# Patient Record
Sex: Female | Born: 1980 | Race: Black or African American | Hispanic: No | Smoking: Never smoker
Health system: Southern US, Community
[De-identification: ages and names within clinical notes are randomized; demographics above are authoritative.]

## PROBLEM LIST (undated history)

## (undated) DIAGNOSIS — D649 Anemia, unspecified: Secondary | ICD-10-CM

## (undated) DIAGNOSIS — D219 Benign neoplasm of connective and other soft tissue, unspecified: Secondary | ICD-10-CM

## (undated) HISTORY — PX: INDUCED ABORTION: SHX677

## (undated) HISTORY — PX: WISDOM TOOTH EXTRACTION: SHX21

---

## 2012-06-08 ENCOUNTER — Other Ambulatory Visit (HOSPITAL_COMMUNITY)
Admission: RE | Admit: 2012-06-08 | Discharge: 2012-06-08 | Disposition: A | Discharge status: Home / Self Care | Payer: BC Managed Care – PPO | Source: Ambulatory Visit | Attending: Obstetrics and Gynecology | Admitting: Obstetrics and Gynecology

## 2012-06-08 DIAGNOSIS — Z01419 Encounter for gynecological examination (general) (routine) without abnormal findings: Secondary | ICD-10-CM | POA: Insufficient documentation

## 2012-06-08 DIAGNOSIS — Z1151 Encounter for screening for human papillomavirus (HPV): Secondary | ICD-10-CM | POA: Insufficient documentation

## 2012-06-08 DIAGNOSIS — Z113 Encounter for screening for infections with a predominantly sexual mode of transmission: Secondary | ICD-10-CM | POA: Insufficient documentation

## 2014-08-22 ENCOUNTER — Other Ambulatory Visit (HOSPITAL_COMMUNITY)
Admission: RE | Admit: 2014-08-22 | Discharge: 2014-08-22 | Disposition: A | Discharge status: Home / Self Care | Payer: Medicaid Other | Source: Ambulatory Visit | Attending: Nurse Practitioner | Admitting: Nurse Practitioner

## 2014-08-22 ENCOUNTER — Other Ambulatory Visit: Payer: Self-pay | Admitting: Nurse Practitioner

## 2014-08-22 DIAGNOSIS — Z01419 Encounter for gynecological examination (general) (routine) without abnormal findings: Secondary | ICD-10-CM | POA: Diagnosis not present

## 2014-08-22 DIAGNOSIS — Z113 Encounter for screening for infections with a predominantly sexual mode of transmission: Secondary | ICD-10-CM | POA: Diagnosis present

## 2014-08-23 LAB — CYTOLOGY - PAP

## 2015-10-24 ENCOUNTER — Encounter (HOSPITAL_COMMUNITY): Payer: Self-pay | Admitting: Emergency Medicine

## 2015-10-24 ENCOUNTER — Ambulatory Visit (HOSPITAL_COMMUNITY)
Admission: EM | Admit: 2015-10-24 | Discharge: 2015-10-24 | Disposition: A | Discharge status: Home / Self Care | Payer: Medicaid Other | Attending: Family Medicine | Admitting: Family Medicine

## 2015-10-24 DIAGNOSIS — R52 Pain, unspecified: Secondary | ICD-10-CM

## 2015-10-24 MED ORDER — DICLOFENAC SODIUM 50 MG PO TBEC: 50.0000 mg | DELAYED_RELEASE_TABLET | Freq: Two times a day (BID) | ORAL | 1 refills | Status: DC
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
## 2015-10-25 ENCOUNTER — Emergency Department (HOSPITAL_BASED_OUTPATIENT_CLINIC_OR_DEPARTMENT_OTHER): Payer: No Typology Code available for payment source

## 2015-10-25 ENCOUNTER — Emergency Department (HOSPITAL_BASED_OUTPATIENT_CLINIC_OR_DEPARTMENT_OTHER)
Admission: EM | Admit: 2015-10-25 | Discharge: 2015-10-25 | Disposition: A | Discharge status: Home / Self Care | Payer: No Typology Code available for payment source | Attending: Emergency Medicine | Admitting: Emergency Medicine

## 2015-10-25 ENCOUNTER — Encounter (HOSPITAL_BASED_OUTPATIENT_CLINIC_OR_DEPARTMENT_OTHER): Payer: Self-pay | Admitting: Emergency Medicine

## 2015-10-25 DIAGNOSIS — Z79899 Other long term (current) drug therapy: Secondary | ICD-10-CM | POA: Insufficient documentation

## 2015-10-25 DIAGNOSIS — S39012D Strain of muscle, fascia and tendon of lower back, subsequent encounter: Secondary | ICD-10-CM | POA: Insufficient documentation

## 2015-10-25 DIAGNOSIS — Y9389 Activity, other specified: Secondary | ICD-10-CM | POA: Diagnosis not present

## 2015-10-25 DIAGNOSIS — S060X0D Concussion without loss of consciousness, subsequent encounter: Secondary | ICD-10-CM | POA: Diagnosis not present

## 2015-10-25 DIAGNOSIS — S161XXD Strain of muscle, fascia and tendon at neck level, subsequent encounter: Secondary | ICD-10-CM | POA: Diagnosis not present

## 2015-10-25 DIAGNOSIS — Y999 Unspecified external cause status: Secondary | ICD-10-CM | POA: Insufficient documentation

## 2015-10-25 DIAGNOSIS — S199XXD Unspecified injury of neck, subsequent encounter: Secondary | ICD-10-CM | POA: Diagnosis present

## 2015-10-25 DIAGNOSIS — Y92410 Unspecified street and highway as the place of occurrence of the external cause: Secondary | ICD-10-CM | POA: Insufficient documentation

## 2015-10-25 LAB — URINALYSIS, ROUTINE W REFLEX MICROSCOPIC
Bilirubin Urine: NEGATIVE
Glucose, UA: NEGATIVE mg/dL
Ketones, ur: 15 mg/dL — AB
NITRITE: NEGATIVE
PH: 6.5 (ref 5.0–8.0)
Protein, ur: 30 mg/dL — AB
SPECIFIC GRAVITY, URINE: 1.015 (ref 1.005–1.030)

## 2015-10-25 LAB — URINE MICROSCOPIC-ADD ON

## 2015-10-25 LAB — PREGNANCY, URINE: PREG TEST UR: NEGATIVE

## 2015-10-25 MED ORDER — IBUPROFEN 800 MG PO TABS: 800.0000 mg | ORAL_TABLET | Freq: Three times a day (TID) | ORAL | 0 refills | Status: DC

## 2015-10-25 MED ORDER — KETOROLAC TROMETHAMINE 60 MG/2ML IM SOLN
60.0000 mg | Freq: Once | INTRAMUSCULAR | Status: AC
  Administered 2015-10-25: 60 mg via INTRAMUSCULAR
  Filled 2015-10-25: qty 2

## 2015-10-25 MED ORDER — ORPHENADRINE CITRATE ER 100 MG PO TB12: 100.0000 mg | ORAL_TABLET | Freq: Two times a day (BID) | ORAL | 0 refills | Status: DC
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
## 2015-10-25 NOTE — ED Notes (Signed)
Patient transported to X-ray 
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
## 2015-10-27 LAB — URINE CULTURE

## 2015-10-28 ENCOUNTER — Ambulatory Visit: Payer: Medicaid Other | Admitting: Family Medicine

## 2016-03-14 ENCOUNTER — Inpatient Hospital Stay (HOSPITAL_COMMUNITY)
Admission: AD | Admit: 2016-03-14 | Discharge: 2016-03-14 | Disposition: A | Discharge status: Home / Self Care | Payer: BLUE CROSS/BLUE SHIELD | Source: Ambulatory Visit | Attending: Obstetrics and Gynecology | Admitting: Obstetrics and Gynecology

## 2016-03-14 ENCOUNTER — Encounter (HOSPITAL_COMMUNITY): Payer: Self-pay | Admitting: *Deleted

## 2016-03-14 ENCOUNTER — Telehealth: Payer: Self-pay | Admitting: Obstetrics and Gynecology

## 2016-03-14 DIAGNOSIS — Z975 Presence of (intrauterine) contraceptive device: Secondary | ICD-10-CM

## 2016-03-14 DIAGNOSIS — D649 Anemia, unspecified: Secondary | ICD-10-CM

## 2016-03-14 DIAGNOSIS — N939 Abnormal uterine and vaginal bleeding, unspecified: Secondary | ICD-10-CM

## 2016-03-14 DIAGNOSIS — D6489 Other specified anemias: Secondary | ICD-10-CM | POA: Insufficient documentation

## 2016-03-14 DIAGNOSIS — Z79899 Other long term (current) drug therapy: Secondary | ICD-10-CM | POA: Insufficient documentation

## 2016-03-14 HISTORY — DX: Anemia, unspecified: D64.9

## 2016-03-14 HISTORY — DX: Benign neoplasm of connective and other soft tissue, unspecified: D21.9

## 2016-03-14 LAB — URINALYSIS, ROUTINE W REFLEX MICROSCOPIC
Bacteria, UA: NONE SEEN
Bilirubin Urine: NEGATIVE
GLUCOSE, UA: NEGATIVE mg/dL
Ketones, ur: 5 mg/dL — AB
LEUKOCYTES UA: NEGATIVE
NITRITE: NEGATIVE
PH: 5 (ref 5.0–8.0)
Protein, ur: NEGATIVE mg/dL
SPECIFIC GRAVITY, URINE: 1.021 (ref 1.005–1.030)

## 2016-03-14 LAB — CBC
HEMATOCRIT: 25 % — AB (ref 36.0–46.0)
HEMOGLOBIN: 8.3 g/dL — AB (ref 12.0–15.0)
MCH: 27.2 pg (ref 26.0–34.0)
MCHC: 33.2 g/dL (ref 30.0–36.0)
MCV: 82 fL (ref 78.0–100.0)
Platelets: 243 10*3/uL (ref 150–400)
RBC: 3.05 MIL/uL — ABNORMAL LOW (ref 3.87–5.11)
RDW: 19.5 % — ABNORMAL HIGH (ref 11.5–15.5)
WBC: 6.6 10*3/uL (ref 4.0–10.5)

## 2016-03-14 LAB — POCT PREGNANCY, URINE: PREG TEST UR: NEGATIVE

## 2016-03-14 MED ORDER — LACTATED RINGERS IV BOLUS (SEPSIS)
1000.0000 mL | Freq: Once | INTRAVENOUS | Status: AC
  Administered 2016-03-14: 1000 mL via INTRAVENOUS

## 2016-03-14 MED ORDER — NORGESTIMATE-ETH ESTRADIOL 0.25-35 MG-MCG PO TABS: 1.0000 | ORAL_TABLET | Freq: Every day | ORAL | 11 refills | Status: DC

## 2016-03-14 MED ORDER — SODIUM CHLORIDE 0.9 % IV SOLN
510.0000 mg | Freq: Once | INTRAVENOUS | Status: AC
  Administered 2016-03-14: 510 mg via INTRAVENOUS
  Filled 2016-03-14: qty 17

## 2016-03-14 NOTE — MAU Note (Signed)
Pt states that she has had problems ever since she got her mirena;

## 2016-03-14 NOTE — MAU Note (Signed)
C/o random vaginal bleeding (several times a month) with large blood clots for months; very heavy bleeding yesterday and felt faint; hx of fibroids; has a mirena in place; upt was negative today; has been told that she needs a hysterectomy;

## 2016-03-14 NOTE — MAU Provider Note (Signed)
History     CSN: [id number]  Arrival date and time: 03/14/16 [id number]   First Provider Initiated Contact with Patient 03/14/16 1026      Chief Complaint  Patient presents with  . Vaginal Bleeding   Non-pregnant female here with heavy vaginal bleeding. She reports VB every day since Mirena was placed 6 months ago. Some days she passes large blood clots and bleeding is heavy as did yesterday. Today bleeding is lite. She has many episodes of soiling her clothes. She reports passing out yesterday. She had pelvic US last week in office and was told uterine fibroids were larger, IUD was in place, and recommended a hysterectomy. No new partner, remote hx of trich.     Pertinent Gynecological History: Bleeding: dysfunctional uterine bleeding Contraception: IUD Blood transfusions: none Sexually transmitted diseases: past history: +trich Last pap: normal Date: 2016   Past Medical History:  Diagnosis Date  . Anemia   . Fibroid     Past Surgical History:  Procedure Laterality Date  . CESAREAN SECTION    . INDUCED ABORTION    . WISDOM TOOTH EXTRACTION      No family history on file.  Social History  Substance Use Topics  . Smoking status: Never Smoker  . Smokeless tobacco: Never Used  . Alcohol use No    Allergies: No Known Allergies  Prescriptions Prior to Admission  Medication Sig Dispense Refill Last Dose  . calcium carbonate (OSCAL) 1500 (600 Ca) MG TABS tablet Take 600 mg of elemental calcium by mouth 2 (two) times daily.   03/13/2016 at Unknown time  . ferrous sulfate 325 (65 FE) MG tablet Take 325 mg by mouth at bedtime.    03/13/2016 at Unknown time  . levonorgestrel (MIRENA) 20 MCG/24HR IUD 1 each by Intrauterine route once.   08/04/2015 at unknown  . Multiple Vitamin (MULTIVITAMIN WITH MINERALS) TABS tablet Take 1 tablet by mouth daily.   03/13/2016 at Unknown time    Review of Systems  Constitutional: Negative for chills and fever.  Respiratory: Negative for shortness  of breath.   Cardiovascular: Negative for chest pain.  Genitourinary: Positive for vaginal bleeding.  Neurological: Positive for dizziness (yesterday).   Physical Exam   Blood pressure 90/76, pulse (!) 125, temperature 98.4 F (36.9 C), temperature source Oral, resp. rate 18.  Physical Exam  Nursing note and vitals reviewed. Constitutional: She is oriented to person, place, and time. She appears well-developed and well-nourished. No distress.  HENT:  Head: Normocephalic and atraumatic.  Neck: Normal range of motion.  Respiratory: Effort normal.  GI: Soft. She exhibits no distension and no mass. There is no tenderness. There is no rebound and no guarding.  Genitourinary:  Genitourinary Comments: External: no lesions or erythema Vagina: rugated, parous, scant bloody discharge Uterus: non enlarged, anteverted, non tender, no CMT Adnexae: no masses, no tenderness left, no tenderness right   Musculoskeletal: Normal range of motion.  Neurological: She is alert and oriented to person, place, and time.  Skin: Skin is warm and dry.  Psychiatric: She has a normal mood and affect.   Results for orders placed or performed during the hospital encounter of 03/14/16 (from the past 24 hour(s))  Urinalysis, Routine w reflex microscopic     Status: Abnormal   Collection Time: 03/14/16  9:50 AM  Result Value Ref Range   Color, Urine YELLOW YELLOW   APPearance HAZY (A) CLEAR   Specific Gravity, Urine 1.021 1.005 - 1.030   pH 5.0 5.0 -  8.0   Glucose, UA NEGATIVE NEGATIVE mg/dL   Hgb urine dipstick MODERATE (A) NEGATIVE   Bilirubin Urine NEGATIVE NEGATIVE   Ketones, ur 5 (A) NEGATIVE mg/dL   Protein, ur NEGATIVE NEGATIVE mg/dL   Nitrite NEGATIVE NEGATIVE   Leukocytes, UA NEGATIVE NEGATIVE   RBC / HPF TOO NUMEROUS TO COUNT 0 - 5 RBC/hpf   WBC, UA TOO NUMEROUS TO COUNT 0 - 5 WBC/hpf   Bacteria, UA NONE SEEN NONE SEEN   Squamous Epithelial / LPF 0-5 (A) NONE SEEN   WBC Clumps PRESENT     Mucous PRESENT    Hyaline Casts, UA PRESENT   Pregnancy, urine POC     Status: None   Collection Time: 03/14/16 10:06 AM  Result Value Ref Range   Preg Test, Ur NEGATIVE NEGATIVE  CBC     Status: Abnormal   Collection Time: 03/14/16 10:44 AM  Result Value Ref Range   WBC 6.6 4.0 - 10.5 K/uL   RBC 3.05 (L) 3.87 - 5.11 MIL/uL   Hemoglobin 8.3 (L) 12.0 - 15.0 g/dL   HCT 25.0 (L) 36.0 - 46.0 %   MCV 82.0 78.0 - 100.0 fL   MCH 27.2 26.0 - 34.0 pg   MCHC 33.2 30.0 - 36.0 g/dL   RDW 19.5 (H) 11.5 - 15.5 %   Platelets 243 150 - 400 K/uL   MAU Course  Procedures LR 1 L Feraheme infusion   MDM Labs ordered and reviewed. Presentation, clinical findings, and plan discussed with Dr. Cletis Media. Pt feels much better after infusion and IVF. VSS. Stable for discharge home.   Assessment and Plan   1. Abnormal uterine bleeding (AUB)   2. IUD contraception   3. Symptomatic anemia    Discharge home Follow up with [facility] in 1 week Rx Sprintec taper Continue Fe  Allergies as of 03/14/2016   No Known Allergies     Medication List    TAKE these medications   calcium carbonate 1500 (600 Ca) MG Tabs tablet Commonly known as:  OSCAL Take 600 mg of elemental calcium by mouth 2 (two) times daily.   ferrous sulfate 325 (65 FE) MG tablet Take 325 mg by mouth at bedtime.   levonorgestrel 20 MCG/24HR IUD Commonly known as:  MIRENA 1 each by Intrauterine route once.   multivitamin with minerals Tabs tablet Take 1 tablet by mouth daily.   norgestimate-ethinyl estradiol 0.25-35 MG-MCG tablet Commonly known as:  ORTHO-CYCLEN,SPRINTEC,PREVIFEM Take 1 tablet by mouth daily.      Julianne Handler, CNM 03/14/2016, 10:27 AM

## 2016-03-14 NOTE — Discharge Instructions (Signed)
Abnormal Uterine Bleeding Abnormal uterine bleeding means bleeding from the vagina that is not your normal menstrual period. This can be:  Bleeding or spotting between periods.  Bleeding after sex (sexual intercourse).  Bleeding that is heavier or more than normal.  Periods that last longer than usual.  Bleeding after menopause. There are many problems that may cause this. Treatment will depend on the cause of the bleeding. Any kind of bleeding that is not normal should be reviewed by your doctor. Follow these instructions at home: Watch your condition for any changes. These actions may lessen any discomfort you are having:  Do not use tampons or douches as told by your doctor.  Change your pads often. You should get regular pelvic exams and Pap tests. Keep all appointments for tests as told by your doctor. Contact a doctor if:  You are bleeding for more than 1 week.  You feel dizzy at times. Get help right away if:  You pass out.  You have to change pads every 15 to 30 minutes.  You have belly pain.  You have a fever.  You become sweaty or weak.  You are passing large blood clots from the vagina.  You feel sick to your stomach (nauseous) and throw up (vomit). This information is not intended to replace advice given to you by your health care provider. Make sure you discuss any questions you have with your health care provider. Document Released: 11/15/2008 Document Revised: 06/26/2015 Document Reviewed: 08/17/2012 Elsevier Interactive Patient Education  2017 [employer].

## 2016-03-14 NOTE — Telephone Encounter (Signed)
TC from pt. Managed by [facility] for fibroids, IUD in place. Reports heavy bleeding, to the point of saturating several pads, tampons, underwear and clothes. Concerned that she is not able to wait to be seen at the office tomorrow, go to church today, or work tomorrow. Advised to come to MAU to be formally evaluated; pt in agreement. Address to [facility] given to pt.
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
## 2016-04-22 ENCOUNTER — Encounter: Payer: Self-pay | Admitting: Hematology & Oncology

## 2016-04-22 ENCOUNTER — Ambulatory Visit (HOSPITAL_BASED_OUTPATIENT_CLINIC_OR_DEPARTMENT_OTHER): Payer: BLUE CROSS/BLUE SHIELD | Admitting: Hematology & Oncology

## 2016-04-22 ENCOUNTER — Other Ambulatory Visit (HOSPITAL_BASED_OUTPATIENT_CLINIC_OR_DEPARTMENT_OTHER): Payer: BLUE CROSS/BLUE SHIELD

## 2016-04-22 ENCOUNTER — Ambulatory Visit: Payer: BLUE CROSS/BLUE SHIELD

## 2016-04-22 VITALS — BP 102/59 | HR 93 | Temp 98.1°F | Resp 16 | Wt 103.0 lb

## 2016-04-22 DIAGNOSIS — D500 Iron deficiency anemia secondary to blood loss (chronic): Secondary | ICD-10-CM

## 2016-04-22 DIAGNOSIS — N921 Excessive and frequent menstruation with irregular cycle: Secondary | ICD-10-CM | POA: Diagnosis not present

## 2016-04-22 LAB — COMPREHENSIVE METABOLIC PANEL (CC13)
ALT: 16 IU/L (ref 0–32)
AST: 15 IU/L (ref 0–40)
Albumin, Serum: 3.7 g/dL (ref 3.5–5.5)
Albumin/Globulin Ratio: 1.1 — ABNORMAL LOW (ref 1.2–2.2)
Alkaline Phosphatase, S: 33 IU/L — ABNORMAL LOW (ref 39–117)
BUN/Creatinine Ratio: 16 (ref 9–23)
BUN: 10 mg/dL (ref 6–20)
Bilirubin Total: <0.2 mg/dL (ref 0.0–1.2)
CO2: 25 mmol/L (ref 18–29)
CREATININE: 0.62 mg/dL (ref 0.57–1.00)
Calcium, Ser: 8.8 mg/dL (ref 8.7–10.2)
Chloride, Ser: 104 mmol/L (ref 96–106)
GFR calc Af Amer: 134 mL/min/{1.73_m2} (ref >59)
GFR calc non Af Amer: 116 mL/min/{1.73_m2} (ref >59)
GLOBULIN, TOTAL: 3.3 g/dL (ref 1.5–4.5)
Glucose: 106 mg/dL — ABNORMAL HIGH (ref 65–99)
Potassium, Ser: 3.6 mmol/L (ref 3.5–5.2)
SODIUM: 136 mmol/L (ref 134–144)
Total Protein: 7 g/dL (ref 6.0–8.5)

## 2016-04-22 LAB — CHCC SATELLITE - SMEAR

## 2016-04-22 LAB — CBC WITH DIFFERENTIAL (CANCER CENTER ONLY)
BASO#: 0 10*3/uL (ref 0.0–0.2)
BASO%: 0.3 % (ref 0.0–2.0)
EOS%: 1 % (ref 0.0–7.0)
Eosinophils Absolute: 0.1 10*3/uL (ref 0.0–0.5)
HCT: 35.8 % (ref 34.8–46.6)
HGB: 11.8 g/dL (ref 11.6–15.9)
LYMPH#: 1.1 10*3/uL (ref 0.9–3.3)
LYMPH%: 12.3 % — ABNORMAL LOW (ref 14.0–48.0)
MCH: 30.7 pg (ref 26.0–34.0)
MCHC: 33 g/dL (ref 32.0–36.0)
MCV: 93 fL (ref 81–101)
MONO#: 0.3 10*3/uL (ref 0.1–0.9)
MONO%: 3.2 % (ref 0.0–13.0)
NEUT#: 7.6 10*3/uL — ABNORMAL HIGH (ref 1.5–6.5)
NEUT%: 83.2 % — AB (ref 39.6–80.0)
PLATELETS: 353 10*3/uL (ref 145–400)
RBC: 3.84 10*6/uL (ref 3.70–5.32)
RDW: 13.4 % (ref 11.1–15.7)
WBC: 9.1 10*3/uL (ref 3.9–10.0)

## 2016-04-22 NOTE — Progress Notes (Signed)
Referral MD  Reason for Referral: Abnormal uterine bleeding, iron deficiency anemia secondary to menometrorrhagia   Chief Complaint  Patient presents with  . New Patient (Initial Visit)  : I may need surgery because of heavy cycles  HPI: Mary Brock is a very charming 36 year old African-American female. She has been in very good health. Her only problem has been excessive uterine bleeding. She sees Dr. Nelda Marseille for OB/GYN issues.  She does have uterine fibroids. She has not had any blood transfusions. She has had iron transfusions. She has been quite iron deficient from bleeding.  From the medical records that we have on this [place], lab work done back in February showed a white cell count of 6.6. Hemoglobin 8.3 and platelet count was 243,000. Her MCV was 82. She really has had a Mirena IUD.  She has had past surgery without any bleeding problems. She has had wisdom teeth taken out without bleeding problems. She has had a C-section without any bleeding problem.  There is no history of sickle cell in the family. Again there is no family history of bleeding.  She has no spontaneous bleeding. There is no spontaneous bruising.  She does not take Motrin or Aleve. She does not take aspirin.  She does not smoke. She may have a drink on occasion.  She works for a [employer].  She has a 23 year old son who actually likes hockey.  She has had no weight loss or weight gain. She is not a vegetarian.  She does not she will ice.  She currently is on iron supplementation. She is doing okay with this.  She's had no obvious change in bowel or bladder habits.  Overall, her performance status is ECOG 0.    Past Medical History:  Diagnosis Date  . Anemia   . Fibroid   :  Past Surgical History:  Procedure Laterality Date  . CESAREAN SECTION    . INDUCED ABORTION    . WISDOM TOOTH EXTRACTION    :   Current Outpatient Prescriptions:  .  Norethindrone-Eth Estradiol (VYFEMLA PO),  Take 0.4 mg by mouth daily., Disp: , Rfl:  .  calcium carbonate (OSCAL) 1500 (600 Ca) MG TABS tablet, Take 600 mg of elemental calcium by mouth 2 (two) times daily., Disp: , Rfl:  .  ferrous sulfate 325 (65 FE) MG tablet, Take 325 mg by mouth at bedtime. , Disp: , Rfl:  .  Multiple Vitamin (MULTIVITAMIN WITH MINERALS) TABS tablet, Take 1 tablet by mouth daily., Disp: , Rfl: :  :  No Known Allergies:  History reviewed. No pertinent family history.:  Social History   Social History  . Marital status: Married    Spouse name: N/A  . Number of children: N/A  . Years of education: N/A   Occupational History  . Not on file.   Social History Main Topics  . Smoking status: Never Smoker  . Smokeless tobacco: Never Used  . Alcohol use No  . Drug use: No  . Sexual activity: No   Other Topics Concern  . Not on file   Social History Narrative  . No narrative on file  :  Pertinent items are noted in HPI.  Exam:Well-developed and well-nourished African-American female in no obvious distress. Vital signs show a temperature of 98.1. Pulse 93. Blood pressure 102/59. Weight is 103 pounds. Head and neck exam shows no ocular or oral lesions. She has no palpable cervical or supraclavicular lymph nodes. Thyroid is nonpalpable. Lungs are clear  to percussion and ascultation bilaterally. Cardiac exam regular rate and rhythm with no murmurs, rubs or bruits. Abdomen is soft. She has good bowel sounds. There is no fluid wave. There is no palpable liver or spleen tip. She may have some slight tenderness in the suprapubic region. Back exam shows no tenderness over the spine, ribs or hips. Extremities shows no clubbing, cyanosis or edema. She has good range of motion of her joints. Skin exam shows no rashes, ecchymoses or petechia. Neurological exam shows no focal neurological deficits.     Recent Labs  04/22/16 1335  WBC 9.1  HGB 11.8  HCT 35.8  PLT 353   No results for input(s): NA, K, CL, CO2,  GLUCOSE, BUN, CREATININE, CALCIUM in the last 72 hours.  Blood smear review:  Normochromic and normocytic population of red blood cells. She has no anisocytosis or poikilocytosis. There are no target cells. She has no nucleated red blood cells. There is no rouleau formation. White cells been normal in morphology maturation. There is no immature myeloid or lymphoid forms. She has no hypersegmented polys. Platelets are adequate in number and size. Platelets are well granulated.  Pathology: None     Assessment and Plan:   Mary Brock is a very nice 36 year old African-American female. She has excessive uterine bleeding. I do not believe that she has any type of bleeding disorder. However, we will check her for I will Brand disease. Given that she is African-American, I would not think that she would have any type of clotting factor deficiency.  I have to believe that the bleeding is probably from her fibroids and that having a hysterectomy is going to be the way to treat this. I'm unsure if a uterine lining ablation or myomectomy could be done.  Her hemoglobin certainly is good today. The iron that she is taking obviously is working.  I spent about 45 minutes with she and her mom. I did give her a prayer blanket. I answered all of her questions.  As nice if she is, I just don't think we have to get her back unless we find something with her lab work.  Her surgery is scheduled for April 11th, so we should have all of our labs back.

## 2016-04-23 ENCOUNTER — Telehealth: Payer: Self-pay | Admitting: *Deleted

## 2016-04-23 LAB — RETICULOCYTES: Reticulocyte Count: 0.9 % (ref 0.6–2.6)

## 2016-04-23 LAB — VON WILLEBRAND PANEL
FACTOR VIII ACTIVITY: 87 % (ref 57–163)
VON WILLEBRAND AG: 89 % (ref 50–200)
vWF Activity: 79 % (ref 50–200)

## 2016-04-23 LAB — APTT: aPTT: 29 s (ref 24–33)

## 2016-04-23 LAB — IRON AND TIBC
%SAT: 21 % (ref 21–57)
Iron: 68 ug/dL (ref 41–142)
TIBC: 321 ug/dL (ref 236–444)
UIBC: 253 ug/dL (ref 120–384)

## 2016-04-23 LAB — FERRITIN: FERRITIN: 13 ng/mL (ref 9–269)

## 2016-04-23 LAB — PROTHROMBIN TIME (PT)
INR: 1 (ref 0.8–1.2)
Prothrombin Time: 10.5 s (ref 9.1–12.0)

## 2016-04-23 NOTE — Telephone Encounter (Addendum)
RN spoke to patient   ----- Message from Volanda Napoleon, MD sent at 04/23/2016 11:15 AM EDT ----- Call - so far No abnormal bleeding tests.  Iron level is ok.  You must keep taking oral iron!!!!  pete  Volanda Napoleon, MD  P Onc Nurse Hp        Call - your last test for bleeding came back normal!! I really think that the fibroids are the reason for the heavy cycles.   Please send all of the lab results over to Dr. Janyth Pupa -- OB/GYN.   Thanks!!   Mary Brock

## 2016-04-26 ENCOUNTER — Telehealth: Payer: Self-pay | Admitting: *Deleted

## 2016-04-26 LAB — HEMOGLOBINOPATHY EVALUATION
HEMOGLOBIN A2 QUANTITATION: 2.4 % (ref 1.8–3.2)
HGB A: 97.6 % (ref 96.4–98.8)
HGB C: 0 %
HGB S: 0 %
HGB VARIANT: 0 %
Hemoglobin F Quantitation: 0 % (ref 0.0–2.0)

## 2016-04-26 NOTE — Telephone Encounter (Signed)
LMVM on personal cell phone that so far there were No abnormal bleeding tests. Iron level is ok. You must keep taking oral iron!!!! your last test for bleeding came back normal!! I really think that the fibroids are the reason for the heavy cycles.   Will send all of the lab results over to Dr. Janyth Pupa -- OB/GYN per Dr. Marin Olp request.

## 2016-05-05 NOTE — Patient Instructions (Addendum)
Your procedure is scheduled on:  Wednesday, 4/11  Enter through the Main Entrance of [facility] at: 10 am  Pick up the phone at the desk and dial [phone].  Call this number if you have problems the morning of surgery: [phone].  Remember: Do NOT eat or drink (including water) after midnight Tuesday 4/10  Take these medicines the morning of surgery with a SIP OF WATER:  None  Do NOT wear jewelry (body piercing), metal hair clips/bobby pins, make-up, or nail polish. Do NOT wear lotions, powders, or perfumes.  You may wear deoderant. Do NOT shave for 48 hours prior to surgery. Do NOT bring valuables to the hospital. Contacts may not be worn into surgery.  Leave suitcase in car.  After surgery it may be brought to your room.  For patients admitted to the hospital, checkout time is 11:00 AM the day of discharge. Home with friend Chrissy

## 2016-05-06 ENCOUNTER — Encounter (HOSPITAL_COMMUNITY)
Admission: RE | Admit: 2016-05-06 | Discharge: 2016-05-06 | Disposition: A | Discharge status: Home / Self Care | Payer: BLUE CROSS/BLUE SHIELD | Source: Ambulatory Visit | Attending: Obstetrics & Gynecology | Admitting: Obstetrics & Gynecology

## 2016-05-06 ENCOUNTER — Encounter (HOSPITAL_COMMUNITY): Payer: Self-pay

## 2016-05-06 DIAGNOSIS — Z01812 Encounter for preprocedural laboratory examination: Secondary | ICD-10-CM | POA: Insufficient documentation

## 2016-05-06 LAB — COMPREHENSIVE METABOLIC PANEL
ALBUMIN: 3.7 g/dL (ref 3.5–5.0)
ALT: 17 U/L (ref 14–54)
AST: 18 U/L (ref 15–41)
Alkaline Phosphatase: 29 U/L — ABNORMAL LOW (ref 38–126)
Anion gap: 5 (ref 5–15)
BUN: 10 mg/dL (ref 6–20)
CHLORIDE: 105 mmol/L (ref 101–111)
CO2: 28 mmol/L (ref 22–32)
Calcium: 8.7 mg/dL — ABNORMAL LOW (ref 8.9–10.3)
Creatinine, Ser: 0.65 mg/dL (ref 0.44–1.00)
GFR calc Af Amer: >60 mL/min (ref >60)
GFR calc non Af Amer: >60 mL/min (ref >60)
Glucose, Bld: 71 mg/dL (ref 65–99)
POTASSIUM: 3.8 mmol/L (ref 3.5–5.1)
SODIUM: 138 mmol/L (ref 135–145)
Total Bilirubin: 0.4 mg/dL (ref 0.3–1.2)
Total Protein: 6.8 g/dL (ref 6.5–8.1)

## 2016-05-06 LAB — ABO/RH: ABO/RH(D): O POS

## 2016-05-06 LAB — CBC
HCT: 33.3 % — ABNORMAL LOW (ref 36.0–46.0)
Hemoglobin: 10.8 g/dL — ABNORMAL LOW (ref 12.0–15.0)
MCH: 30.4 pg (ref 26.0–34.0)
MCHC: 32.4 g/dL (ref 30.0–36.0)
MCV: 93.8 fL (ref 78.0–100.0)
PLATELETS: 392 10*3/uL (ref 150–400)
RBC: 3.55 MIL/uL — AB (ref 3.87–5.11)
RDW: 13.3 % (ref 11.5–15.5)
WBC: 8.5 10*3/uL (ref 4.0–10.5)

## 2016-05-06 LAB — TYPE AND SCREEN
ABO/RH(D): O POS
ANTIBODY SCREEN: NEGATIVE

## 2016-05-11 ENCOUNTER — Encounter (HOSPITAL_COMMUNITY): Payer: Self-pay | Admitting: *Deleted

## 2016-05-11 ENCOUNTER — Observation Stay (HOSPITAL_COMMUNITY)
Admission: AD | Admit: 2016-05-11 | Discharge: 2016-05-11 | Disposition: A | Discharge status: Home / Self Care | Payer: BLUE CROSS/BLUE SHIELD | Source: Ambulatory Visit | Attending: Obstetrics & Gynecology | Admitting: Obstetrics & Gynecology

## 2016-05-11 DIAGNOSIS — D649 Anemia, unspecified: Principal | ICD-10-CM | POA: Insufficient documentation

## 2016-05-11 LAB — HIV ANTIBODY (ROUTINE TESTING W REFLEX): HIV Screen 4th Generation wRfx: NONREACTIVE

## 2016-05-11 LAB — HEMOGLOBIN AND HEMATOCRIT, BLOOD
HCT: 25.3 % — ABNORMAL LOW (ref 36.0–46.0)
HEMOGLOBIN: 8.7 g/dL — AB (ref 12.0–15.0)

## 2016-05-11 LAB — PREPARE RBC (CROSSMATCH)

## 2016-05-11 MED ORDER — SODIUM CHLORIDE 0.9 % IV SOLN
Freq: Once | INTRAVENOUS | Status: AC
  Administered 2016-05-11: 09:00:00 via INTRAVENOUS

## 2016-05-11 MED ORDER — NORETHIN-ETH ESTRADIOL-FE 0.4-35 MG-MCG PO CHEW
1.0000 | CHEWABLE_TABLET | Freq: Every day | ORAL | Status: DC
  Administered 2016-05-11: 1 via ORAL
  Filled 2016-05-11 (×2): qty 1

## 2016-05-11 MED ORDER — LACTATED RINGERS IV SOLN
INTRAVENOUS | Status: DC
  Administered 2016-05-11: 09:00:00 via INTRAVENOUS

## 2016-05-11 MED ORDER — DIPHENHYDRAMINE HCL 25 MG PO CAPS
25.0000 mg | ORAL_CAPSULE | Freq: Once | ORAL | Status: AC
  Administered 2016-05-11: 25 mg via ORAL
  Filled 2016-05-11: qty 1

## 2016-05-11 MED ORDER — NORETHINDRONE-ETH ESTRADIOL 0.4-35 MG-MCG PO TABS: 1.0000 | ORAL_TABLET | Freq: Every day | ORAL | 0 refills | Status: DC

## 2016-05-11 NOTE — Progress Notes (Signed)
Pt  Out in wheelchair   Surgical scrub  Given   To pt  For preop tommorrow

## 2016-05-11 NOTE — H&P (Signed)
36yo G1P1001 who presents as a transfer from [place] due to heavy menstrual bleeding.  Pt is well known to me for this issue and is scheduled for surgery tomorrow, 05/12/16.  Over the weekend, she had run out of her OCPs and started to have significant heavy bleeding to the point where she was sitting in a trash bag.  She states it was pouring out of her including large clots.  She finally came to the ER on Monday night where she was noted to have a drop in her Hgb to 6.7. She was stabilized with fluids, IV premarin and 1unit of pRBCs and transferred to our care.  Early this morning, the patient reports that both her pain and bleeding has stopped.  She reports no vaginal bleeding for a few hours.  Denies fever/chills, SOB.  No headache or dizziness, feeling better than she did previously though still feeling weak.  Last US performed 02/26/16: 10.9cm uterus with 2 fibroids- submucosal anterior midbody 4.2 and 2.3cm.      ROS:  CONSTITUTIONAL:  no Chills. no Fever. no Night sweats. +dizziness, fatigue and weakness yesterday HEENT:  Blurrred vision no. no Double vision.  CARDIOLOGY:  no Chest pain.  RESPIRATORY:  no Shortness of breath. no Cough.  UROLOGY:  no Urinary frequency. no Urinary incontinence. no Urinary urgency.  GASTROENTEROLOGY:  no Abdominal pain. no Appetite change. no Change in bowel movements.  FEMALE REPRODUCTIVE:  no Breast lumps or discharge. no Breast pain. no Unusual vaginal discharge. no Vaginal irritation. no Vaginal itching.  NEUROLOGY:  no Dizziness. no Headache. no Loss of consciousness. Weakness yes.  PSYCHOLOGY:  no Anxiety. no Depression.  SKIN:  no Rash. no Hives.  HEMATOLOGY/LYMPH:  Anemia yes. Using Blood Thinners no.         Medical History: Anemia, Uterine fibroids        Gyn History:  Sexual activity not currently sexually active.  Periods : regular.  LMP Ongoing.  Birth control none.  Last pap smear date 08/22/14 - WNL.  Last mammogram date  N/A.  Abnormal pap smear 5.8.14 - ASCUS, HPV negative.  Denies H/O STD.        OB History:  Pregnancy # 1 live birth, C-section delivery, boy Breast fed - 2 yrs.        Surgical History: c-section 2007.        Hospitalization/Major Diagnostic Procedure: child birht x 1 -c-section (son) .        Family History: Father: deceased, Hypertension, alzheimer's dementia, Prostate Cancer. Mother: alive, Diabetes, spinal issues. Paternal [place] Father: deceased. Paternal Grand Mother: deceased. Maternal Grand Father: deceased. Maternal Grand Mother: deceased. Son(s): alive, GI issues, . 1 son(s) . .  denies any GYN family cancer hx, 1/2 brother, 1/2 sister.       Social History:  General:  Tobacco use  cigarettes: Never smoked Tobacco history last updated 05/04/2016 no EXPOSURE TO PASSIVE SMOKE.  Alcohol: yes, Rare.  Caffeine: yes, very little.  no Recreational drug use.  Marital Status: Separated.  Children: 1, son (s).  OCCUPATION: employed, Working for [employer].        Medications: Taking Balziva(Norethindrone-Eth Estradiol) 0.4-35 MG-MCG Tablet 1 tablet Orally Once a day, Taking iron 1 tab Oral , Taking Calcium 150 MG Tablet Orally       Allergies: Metronidazole: headache.        O: BP (!) 108/57 (BP Location: Left Arm)   Pulse 86   Temp 98.4 F (36.9 C) (Oral)  Resp 16   SpO2 100%  Gen: Appear pale Neuro: alert & oriented x 3 Neck: normal appearance CV: RRR Lungs: CTAB Abd: soft, non-tender, no rebound, no guarding Ext: no edema, negative Homan's bilaterally Psych: mood and affect appropriate  Per [facility] records Hgb 7.8 at midnight then 6.7 around 2 am  A/P: 35yo G1P1001 admitted for OBS due to symptomatic anemia and active bleeding -LR @ 100cc/hr -Plan to transfuse an additional unit of pRBCs, repeat H/H upon completion -ok for general diet -Activity as tolerated -Will continue repeat IV premarin dose if bleeding returns, otherwise pt to pick up  Rx today and take pill later this evening -Scheduled for hysterectomy tomorrow, will consider observation overnight pending her vitals, overall appearance and Hgb level  Janyth Pupa, DO [phone] (pager) [phone] (office)

## 2016-05-11 NOTE — H&P (Signed)
36yo G1P1001 who presents for scheduled LAVH, BS due to persistent AUB.  In fact, the bleeding worsened this past weekend and pt was recently seen yesterday due to the bleeding.  She was given IV premarin and 2u pRBCs and bleeding was noted to improve.  This was the 2nd time she needed a blood transfusion due to her heavy bleeding.  Pt has been treated with several different birth control pills as well as the Mirena, but eventually the heavy bleeding always returns.  Last US performed 02/26/16: 10.9cm uterus with 2 fibroids- submucosal anterior midbody 4.2 and 2.3cm.      ROS:  CONSTITUTIONAL:  no Chills. no Fever. no Night sweats. +dizziness, fatigue and weakness yesterday HEENT:  Blurrred vision no. no Double vision.  CARDIOLOGY:  no Chest pain.  RESPIRATORY:  no Shortness of breath. no Cough.  UROLOGY:  no Urinary frequency. no Urinary incontinence. no Urinary urgency.  GASTROENTEROLOGY:  no Abdominal pain. no Appetite change. no Change in bowel movements.  FEMALE REPRODUCTIVE:  no Breast lumps or discharge. no Breast pain. no Unusual vaginal discharge. no Vaginal irritation. no Vaginal itching.  NEUROLOGY:  no Dizziness. no Headache. no Loss of consciousness. Weakness yes.  PSYCHOLOGY:  no Anxiety. no Depression.  SKIN:  no Rash. no Hives.  HEMATOLOGY/LYMPH:  Anemia yes. Using Blood Thinners no.         Medical History: Anemia, Uterine fibroids        Gyn History:  Sexual activity not currently sexually active.  Periods : irregular (see above).  LMP this past weekend  Birth control none.  Last pap smear date 08/22/14 - WNL.  Last mammogram date N/A.  Abnormal pap smear 5.8.14 - ASCUS, HPV negative.  Denies H/O STD.        OB History:  Pregnancy # 1 live birth, C-section delivery, boy Breast fed - 1 yrs.        Surgical History: c-section 2007.        Hospitalization/Major Diagnostic Procedure: child birht x 1 -c-section (son) .         Family History: Father: deceased, Hypertension, alzheimer's dementia, Prostate Cancer. Mother: alive, Diabetes, spinal issues. Paternal [place] Father: deceased. Paternal Grand Mother: deceased. Maternal Grand Father: deceased. Maternal Grand Mother: deceased. Son(s): alive, GI issues, . 1 son(s) . .  denies any GYN family cancer hx, 1/2 brother, 1/2 sister.       Social History:  General:  Tobacco use  cigarettes: Never smoked Tobacco history last updated 05/04/2016 no EXPOSURE TO PASSIVE SMOKE.  Alcohol: yes, Rare.  Caffeine: yes, very little.  no Recreational drug use.  Marital Status: Separated.  Children: 1, son (s).  OCCUPATION: employed, Working for [employer].        Medications: Taking Balziva(Norethindrone-Eth Estradiol) 0.4-35 MG-MCG Tablet 1 tablet Orally Once a day, Taking iron 1 tab Oral , Taking Calcium 150 MG Tablet Orally       Allergies: Metronidazole: headache.    O:  General Examination:  GENERAL APPEARANCE well developed, well nourished .  SKIN: warm and dry, no rashes .  NECK: supple, normal appearance .  LUNGS: regular breathing rate and effort .  HEART: no murmurs, regular rate and rhythm .  ABDOMEN: soft and not tender, no masses palpated, no rebound, no rigidity .  MUSCULOSKELETAL no calf tenderness bilaterally .  EXTREMITIES: no edema present .  NEUROLOGIC EXAM: alert and oriented x 3.  PSYCH: appropriate mood and affect .   Results for orders  placed or performed during the hospital encounter of 05/11/16 (from the past 24 hour(s))  Prepare RBC     Status: None   Collection Time: 05/11/16  8:30 AM  Result Value Ref Range   Order Confirmation ORDER PROCESSED BY BLOOD BANK   Type and screen [place]     Status: None (Preliminary result)   Collection Time: 05/11/16  8:41 AM  Result Value Ref Range   ABO/RH(D) O POS    Antibody Screen NEG    Sample Expiration 05/14/2016    Unit Number [id number]     Blood Component Type RBC LR PHER1    Unit division 00    Status of Unit ISSUED    Transfusion Status OK TO TRANSFUSE    Crossmatch Result Compatible    Unit Number [id number]    Blood Component Type RED CELLS,LR    Unit division 00    Status of Unit ALLOCATED    Transfusion Status OK TO TRANSFUSE    Crossmatch Result Compatible   HIV antibody (Routine Testing)     Status: None   Collection Time: 05/11/16  8:41 AM  Result Value Ref Range   HIV Screen 4th Generation wRfx Non Reactive Non Reactive  Hemoglobin and hematocrit, blood     Status: Abnormal   Collection Time: 05/11/16  1:44 PM  Result Value Ref Range   Hemoglobin 8.7 (L) 12.0 - 15.0 g/dL   HCT 25.3 (L) 36.0 - 46.0 %    A/P: 35yo G1P1001 who presents for LAVH, BS due to AUB. -NPO -LR @ 125cc/hr -T&S completed yesterday, plan for 2u pRBC on hold - H/H as above -Ancef 2g IV to OR -SCDs to OR -Risk/benefit and alternatives reviewed with patient including risk of bleeding and need for further transfusion, infection and injury to surrounding organs.  Pt aware and wishes to proceed.  Janyth Pupa, DO [phone] (pager) [phone] (office)

## 2016-05-12 ENCOUNTER — Observation Stay (HOSPITAL_COMMUNITY)
Admission: RE | Admit: 2016-05-12 | Discharge: 2016-05-13 | Disposition: A | Discharge status: Home / Self Care | Payer: BLUE CROSS/BLUE SHIELD | Source: Ambulatory Visit | Attending: Obstetrics & Gynecology | Admitting: Obstetrics & Gynecology

## 2016-05-12 ENCOUNTER — Ambulatory Visit (HOSPITAL_COMMUNITY): Payer: BLUE CROSS/BLUE SHIELD | Admitting: Certified Registered Nurse Anesthetist

## 2016-05-12 ENCOUNTER — Encounter (HOSPITAL_COMMUNITY): Payer: Self-pay | Admitting: Certified Registered Nurse Anesthetist

## 2016-05-12 ENCOUNTER — Encounter (HOSPITAL_COMMUNITY)
Admission: RE | Disposition: A | Discharge status: Home / Self Care | Payer: Self-pay | Source: Ambulatory Visit | Attending: Obstetrics & Gynecology

## 2016-05-12 DIAGNOSIS — N939 Abnormal uterine and vaginal bleeding, unspecified: Secondary | ICD-10-CM | POA: Diagnosis present

## 2016-05-12 DIAGNOSIS — D252 Subserosal leiomyoma of uterus: Secondary | ICD-10-CM | POA: Diagnosis not present

## 2016-05-12 DIAGNOSIS — D649 Anemia, unspecified: Secondary | ICD-10-CM | POA: Diagnosis not present

## 2016-05-12 DIAGNOSIS — N920 Excessive and frequent menstruation with regular cycle: Secondary | ICD-10-CM | POA: Insufficient documentation

## 2016-05-12 HISTORY — PX: LAPAROSCOPIC VAGINAL HYSTERECTOMY WITH SALPINGECTOMY: SHX6680

## 2016-05-12 LAB — CBC
HCT: 27.6 % — ABNORMAL LOW (ref 36.0–46.0)
HEMOGLOBIN: 9.3 g/dL — AB (ref 12.0–15.0)
MCH: 31.2 pg (ref 26.0–34.0)
MCHC: 33.7 g/dL (ref 30.0–36.0)
MCV: 92.6 fL (ref 78.0–100.0)
PLATELETS: 259 10*3/uL (ref 150–400)
RBC: 2.98 MIL/uL — AB (ref 3.87–5.11)
RDW: 14 % (ref 11.5–15.5)
WBC: 11.7 10*3/uL — ABNORMAL HIGH (ref 4.0–10.5)

## 2016-05-12 LAB — PREGNANCY, URINE: PREG TEST UR: NEGATIVE

## 2016-05-12 LAB — PREPARE RBC (CROSSMATCH)

## 2016-05-12 SURGERY — HYSTERECTOMY, VAGINAL, LAPAROSCOPY-ASSISTED, WITH SALPINGECTOMY
Anesthesia: General | Site: Abdomen | Laterality: Bilateral

## 2016-05-12 MED ORDER — ROCURONIUM BROMIDE 100 MG/10ML IV SOLN
INTRAVENOUS | Status: DC | PRN
  Administered 2016-05-12: 50 mg via INTRAVENOUS
  Administered 2016-05-12: 10 mg via INTRAVENOUS

## 2016-05-12 MED ORDER — HYDROMORPHONE HCL 1 MG/ML IJ SOLN
0.2500 mg | INTRAMUSCULAR | Status: DC | PRN
  Administered 2016-05-12 (×6): 0.5 mg via INTRAVENOUS

## 2016-05-12 MED ORDER — PROPOFOL 10 MG/ML IV BOLUS
INTRAVENOUS | Status: DC | PRN
  Administered 2016-05-12: 180 mg via INTRAVENOUS

## 2016-05-12 MED ORDER — HYDROMORPHONE HCL 1 MG/ML IJ SOLN
INTRAMUSCULAR | Status: AC
  Filled 2016-05-12: qty 1

## 2016-05-12 MED ORDER — SCOPOLAMINE 1 MG/3DAYS TD PT72
1.0000 | MEDICATED_PATCH | Freq: Once | TRANSDERMAL | Status: DC
  Administered 2016-05-12: 1.5 mg via TRANSDERMAL

## 2016-05-12 MED ORDER — KETOROLAC TROMETHAMINE 30 MG/ML IJ SOLN
INTRAMUSCULAR | Status: AC
  Filled 2016-05-12: qty 1

## 2016-05-12 MED ORDER — PROPOFOL 10 MG/ML IV BOLUS
INTRAVENOUS | Status: AC
  Filled 2016-05-12: qty 20

## 2016-05-12 MED ORDER — DEXAMETHASONE SODIUM PHOSPHATE 10 MG/ML IJ SOLN
INTRAMUSCULAR | Status: DC | PRN
  Administered 2016-05-12: 4 mg via INTRAVENOUS

## 2016-05-12 MED ORDER — CEFAZOLIN SODIUM-DEXTROSE 2-4 GM/100ML-% IV SOLN
2.0000 g | INTRAVENOUS | Status: AC
  Administered 2016-05-12: 2 g via INTRAVENOUS

## 2016-05-12 MED ORDER — MENTHOL 3 MG MT LOZG: 1.0000 | LOZENGE | OROMUCOSAL | Status: DC | PRN

## 2016-05-12 MED ORDER — ONDANSETRON HCL 4 MG/2ML IJ SOLN
4.0000 mg | Freq: Four times a day (QID) | INTRAMUSCULAR | Status: DC | PRN
  Administered 2016-05-12: 4 mg via INTRAVENOUS
  Filled 2016-05-12: qty 2

## 2016-05-12 MED ORDER — FERROUS SULFATE 325 (65 FE) MG PO TABS
325.0000 mg | ORAL_TABLET | Freq: Every day | ORAL | Status: DC
  Administered 2016-05-13: 325 mg via ORAL
  Filled 2016-05-12: qty 1

## 2016-05-12 MED ORDER — MIDAZOLAM HCL 2 MG/2ML IJ SOLN
INTRAMUSCULAR | Status: AC
  Filled 2016-05-12: qty 2

## 2016-05-12 MED ORDER — PHENYLEPHRINE 40 MCG/ML (10ML) SYRINGE FOR IV PUSH (FOR BLOOD PRESSURE SUPPORT)
PREFILLED_SYRINGE | INTRAVENOUS | Status: AC
  Filled 2016-05-12: qty 10

## 2016-05-12 MED ORDER — LACTATED RINGERS IV SOLN
INTRAVENOUS | Status: DC
  Administered 2016-05-12 – 2016-05-13 (×2): via INTRAVENOUS

## 2016-05-12 MED ORDER — LIDOCAINE-EPINEPHRINE (PF) 1 %-1:200000 IJ SOLN
INTRAMUSCULAR | Status: AC
  Filled 2016-05-12: qty 30

## 2016-05-12 MED ORDER — ONDANSETRON HCL 4 MG/2ML IJ SOLN
INTRAMUSCULAR | Status: DC | PRN
  Administered 2016-05-12: 4 mg via INTRAVENOUS

## 2016-05-12 MED ORDER — SUGAMMADEX SODIUM 200 MG/2ML IV SOLN
INTRAVENOUS | Status: DC | PRN
  Administered 2016-05-12: 200 mg via INTRAVENOUS

## 2016-05-12 MED ORDER — MEPERIDINE HCL 25 MG/ML IJ SOLN: 6.2500 mg | INTRAMUSCULAR | Status: DC | PRN

## 2016-05-12 MED ORDER — BUPIVACAINE HCL (PF) 0.25 % IJ SOLN
INTRAMUSCULAR | Status: DC | PRN
  Administered 2016-05-12: 24 mL

## 2016-05-12 MED ORDER — DEXAMETHASONE SODIUM PHOSPHATE 4 MG/ML IJ SOLN
INTRAMUSCULAR | Status: AC
  Filled 2016-05-12: qty 1

## 2016-05-12 MED ORDER — METOCLOPRAMIDE HCL 5 MG/ML IJ SOLN: 10.0000 mg | Freq: Once | INTRAMUSCULAR | Status: DC | PRN

## 2016-05-12 MED ORDER — LIDOCAINE HCL (PF) 1 % IJ SOLN
INTRAMUSCULAR | Status: AC
  Filled 2016-05-12: qty 5

## 2016-05-12 MED ORDER — KETOROLAC TROMETHAMINE 30 MG/ML IJ SOLN
30.0000 mg | Freq: Four times a day (QID) | INTRAMUSCULAR | Status: DC
  Administered 2016-05-12 – 2016-05-13 (×2): 30 mg via INTRAVENOUS
  Filled 2016-05-12 (×2): qty 1

## 2016-05-12 MED ORDER — HYDROMORPHONE HCL 1 MG/ML IJ SOLN
INTRAMUSCULAR | Status: AC
  Administered 2016-05-12: 0.5 mg via INTRAVENOUS
  Filled 2016-05-12: qty 1

## 2016-05-12 MED ORDER — ONDANSETRON HCL 4 MG/2ML IJ SOLN
INTRAMUSCULAR | Status: AC
  Filled 2016-05-12: qty 2

## 2016-05-12 MED ORDER — OXYCODONE-ACETAMINOPHEN 5-325 MG PO TABS
1.0000 | ORAL_TABLET | ORAL | Status: DC | PRN
  Filled 2016-05-12: qty 2

## 2016-05-12 MED ORDER — LACTATED RINGERS IV SOLN
INTRAVENOUS | Status: DC
  Administered 2016-05-12: 125 mL/h via INTRAVENOUS
  Administered 2016-05-12 (×2): via INTRAVENOUS

## 2016-05-12 MED ORDER — MIDAZOLAM HCL 2 MG/2ML IJ SOLN
INTRAMUSCULAR | Status: DC | PRN
  Administered 2016-05-12: 1 mg via INTRAVENOUS

## 2016-05-12 MED ORDER — LIDOCAINE HCL (CARDIAC) 20 MG/ML IV SOLN
INTRAVENOUS | Status: DC | PRN
  Administered 2016-05-12: 30 mg via INTRAVENOUS
  Administered 2016-05-12: 70 mg via INTRAVENOUS

## 2016-05-12 MED ORDER — ONDANSETRON HCL 4 MG PO TABS: 4.0000 mg | ORAL_TABLET | Freq: Four times a day (QID) | ORAL | Status: DC | PRN

## 2016-05-12 MED ORDER — SUGAMMADEX SODIUM 200 MG/2ML IV SOLN
INTRAVENOUS | Status: AC
  Filled 2016-05-12: qty 2

## 2016-05-12 MED ORDER — SIMETHICONE 80 MG PO CHEW: 80.0000 mg | CHEWABLE_TABLET | Freq: Four times a day (QID) | ORAL | Status: DC | PRN

## 2016-05-12 MED ORDER — LACTATED RINGERS IV SOLN: INTRAVENOUS | Status: DC

## 2016-05-12 MED ORDER — SCOPOLAMINE 1 MG/3DAYS TD PT72
MEDICATED_PATCH | TRANSDERMAL | Status: AC
  Administered 2016-05-12: 1.5 mg via TRANSDERMAL
  Filled 2016-05-12: qty 1

## 2016-05-12 MED ORDER — FENTANYL CITRATE (PF) 250 MCG/5ML IJ SOLN
INTRAMUSCULAR | Status: AC
  Filled 2016-05-12: qty 5

## 2016-05-12 MED ORDER — LACTATED RINGERS IR SOLN
Status: DC | PRN
  Administered 2016-05-12: 3000 mL

## 2016-05-12 MED ORDER — DOCUSATE SODIUM 100 MG PO CAPS
100.0000 mg | ORAL_CAPSULE | Freq: Two times a day (BID) | ORAL | Status: DC
  Administered 2016-05-12 – 2016-05-13 (×2): 100 mg via ORAL
  Filled 2016-05-12 (×2): qty 1

## 2016-05-12 MED ORDER — FENTANYL CITRATE (PF) 100 MCG/2ML IJ SOLN
INTRAMUSCULAR | Status: DC | PRN
  Administered 2016-05-12 (×2): 50 ug via INTRAVENOUS
  Administered 2016-05-12 (×2): 25 ug via INTRAVENOUS
  Administered 2016-05-12: 50 ug via INTRAVENOUS

## 2016-05-12 MED ORDER — BUPIVACAINE HCL (PF) 0.25 % IJ SOLN
INTRAMUSCULAR | Status: AC
  Filled 2016-05-12: qty 30

## 2016-05-12 MED ORDER — PHENYLEPHRINE HCL 10 MG/ML IJ SOLN
INTRAMUSCULAR | Status: DC | PRN
  Administered 2016-05-12 (×2): 120 ug via INTRAVENOUS
  Administered 2016-05-12: 80 ug via INTRAVENOUS
  Administered 2016-05-12 (×3): 40 ug via INTRAVENOUS
  Administered 2016-05-12: 80 ug via INTRAVENOUS

## 2016-05-12 MED ORDER — LIDOCAINE-EPINEPHRINE (PF) 1 %-1:200000 IJ SOLN
INTRAMUSCULAR | Status: DC | PRN
  Administered 2016-05-12: 14 mL

## 2016-05-12 MED ORDER — KETOROLAC TROMETHAMINE 30 MG/ML IJ SOLN
INTRAMUSCULAR | Status: DC | PRN
  Administered 2016-05-12: 30 mg via INTRAVENOUS

## 2016-05-12 MED ORDER — KETOROLAC TROMETHAMINE 30 MG/ML IJ SOLN: 30.0000 mg | Freq: Four times a day (QID) | INTRAMUSCULAR | Status: DC

## 2016-05-12 SURGICAL SUPPLY — 45 items
APPLICATOR ARISTA FLEXITIP XL (MISCELLANEOUS) ×3 IMPLANT
CABLE HIGH FREQUENCY MONO STRZ (ELECTRODE) IMPLANT
CLOTH BEACON ORANGE TIMEOUT ST (SAFETY) ×3 IMPLANT
DERMABOND ADVANCED (GAUZE/BANDAGES/DRESSINGS) ×2
DERMABOND ADVANCED .7 DNX12 (GAUZE/BANDAGES/DRESSINGS) ×1 IMPLANT
DRSG OPSITE POSTOP 3X4 (GAUZE/BANDAGES/DRESSINGS) ×3 IMPLANT
DURAPREP 26ML APPLICATOR (WOUND CARE) ×3 IMPLANT
GAUZE PACKING IODOFORM 2 (PACKING) IMPLANT
GLOVE BIOGEL PI IND STRL 6.5 (GLOVE) ×3 IMPLANT
GLOVE BIOGEL PI IND STRL 7.0 (GLOVE) ×6 IMPLANT
GLOVE BIOGEL PI INDICATOR 6.5 (GLOVE) ×6
GLOVE BIOGEL PI INDICATOR 7.0 (GLOVE) ×12
GLOVE ECLIPSE 6.5 STRL STRAW (GLOVE) ×9 IMPLANT
HEMOSTAT ARISTA ABSORB 3G PWDR (MISCELLANEOUS) ×3 IMPLANT
LEGGING LITHOTOMY PAIR STRL (DRAPES) ×3 IMPLANT
NEEDLE INSUFFLATION 120MM (ENDOMECHANICALS) ×3 IMPLANT
OCCLUDER COLPOPNEUMO (BALLOONS) ×3 IMPLANT
PACK LAVH (CUSTOM PROCEDURE TRAY) ×3 IMPLANT
PACK ROBOTIC GOWN (GOWN DISPOSABLE) ×3 IMPLANT
PACK TRENDGUARD 450 HYBRID PRO (MISCELLANEOUS) ×1 IMPLANT
PACK TRENDGUARD 600 HYBRD PROC (MISCELLANEOUS) IMPLANT
PAD OB MATERNITY 4.3X12.25 (PERSONAL CARE ITEMS) ×3 IMPLANT
POUCH LAPAROSCOPIC INSTRUMENT (MISCELLANEOUS) ×3 IMPLANT
PROTECTOR NERVE ULNAR (MISCELLANEOUS) ×6 IMPLANT
SCISSORS LAP 5X35 DISP (ENDOMECHANICALS) IMPLANT
SET IRRIG TUBING LAPAROSCOPIC (IRRIGATION / IRRIGATOR) ×3 IMPLANT
SHEARS HARMONIC ACE PLUS 36CM (ENDOMECHANICALS) ×3 IMPLANT
SLEEVE XCEL OPT CAN 5 100 (ENDOMECHANICALS) ×6 IMPLANT
SOLUTION ELECTROLUBE (MISCELLANEOUS) ×3 IMPLANT
SPOGE SURGIFLO 8M (HEMOSTASIS)
SPONGE SURGIFLO 8M (HEMOSTASIS) IMPLANT
SUT MON AB 4-0 PS1 27 (SUTURE) IMPLANT
SUT VIC AB 0 CT1 18XCR BRD8 (SUTURE) ×2 IMPLANT
SUT VIC AB 0 CT1 27 (SUTURE) ×4
SUT VIC AB 0 CT1 27XBRD ANBCTR (SUTURE) ×2 IMPLANT
SUT VIC AB 0 CT1 36 (SUTURE) ×6 IMPLANT
SUT VIC AB 0 CT1 8-18 (SUTURE) ×4
SUT VICRYL 0 TIES 12 18 (SUTURE) ×3 IMPLANT
TIP UTERINE 6.7X10CM GRN DISP (MISCELLANEOUS) ×3 IMPLANT
TOWEL OR 17X24 6PK STRL BLUE (TOWEL DISPOSABLE) ×6 IMPLANT
TRAY FOLEY CATH SILVER 14FR (SET/KITS/TRAYS/PACK) ×3 IMPLANT
TRENDGUARD 450 HYBRID PRO PACK (MISCELLANEOUS) ×3
TRENDGUARD 600 HYBRID PROC PK (MISCELLANEOUS)
TROCAR XCEL NON-BLD 5MMX100MML (ENDOMECHANICALS) ×3 IMPLANT
WARMER LAPAROSCOPE (MISCELLANEOUS) ×3 IMPLANT

## 2016-05-12 NOTE — Anesthesia Preprocedure Evaluation (Addendum)
Anesthesia Evaluation  Patient identified by MRN, date of birth, ID band Patient awake    Airway Mallampati: II       Dental no notable dental hx. (+) Teeth Intact   Pulmonary neg pulmonary ROS,    Pulmonary exam normal breath sounds clear to auscultation       Cardiovascular negative cardio ROS Normal cardiovascular exam Rhythm:Regular Rate:Normal     Neuro/Psych negative neurological ROS  negative psych ROS   GI/Hepatic negative GI ROS, Neg liver ROS,   Endo/Other  negative endocrine ROS  Renal/GU negative Renal ROS  negative genitourinary   Musculoskeletal negative musculoskeletal ROS (+)   Abdominal   Peds  Hematology  (+) anemia ,   Anesthesia Other Findings   Reproductive/Obstetrics Irregular menses Menorrhagia                            Anesthesia Physical Anesthesia Plan  ASA: II  Anesthesia Plan: General   Post-op Pain Management:    Induction: Intravenous  Airway Management Planned: Oral ETT  Additional Equipment:   Intra-op Plan:   Post-operative Plan: Extubation in OR  Informed Consent: I have reviewed the patients History and Physical, chart, labs and discussed the procedure including the risks, benefits and alternatives for the proposed anesthesia with the patient or authorized representative who has indicated his/her understanding and acceptance.   Dental advisory given  Plan Discussed with: Anesthesiologist, CRNA and Surgeon  Anesthesia Plan Comments:         Anesthesia Quick Evaluation

## 2016-05-12 NOTE — Interval H&P Note (Signed)
History and Physical Interval Note:  05/12/2016 12:15 PM  Mary Brock  has presented today for surgery, with the diagnosis of  Irregular Bleeding  Fibroids Anemia  The various methods of treatment have been discussed with the patient and family. After consideration of risks, benefits and other options for treatment, the patient has consented to  Procedure(s): LAPAROSCOPIC ASSISTED VAGINAL HYSTERECTOMY WITH SALPINGECTOMY (Bilateral) as a surgical intervention .  The patient's history has been reviewed, patient examined, no change in status, stable for surgery.  I have reviewed the patient's chart and labs.  Questions were answered to the patient's satisfaction.     Janyth Pupa, M

## 2016-05-12 NOTE — Transfer of Care (Signed)
Immediate Anesthesia Transfer of Care Note  Patient: Mary Brock  Procedure(s) Performed: Procedure(s): LAPAROSCOPIC ASSISTED VAGINAL HYSTERECTOMY WITH SALPINGECTOMY (Bilateral)  Patient Location: PACU  Anesthesia Type:General  Level of Consciousness: sedated  Airway & Oxygen Therapy: Patient Spontanous Breathing  Post-op Assessment: Report given to RN  Post vital signs: Reviewed and stable  Last Vitals:  Vitals:   05/12/16 1008  BP: 93/65  Pulse: 81  Resp: 18  Temp: 36.7 C    Last Pain:  Vitals:   05/12/16 1008  TempSrc: Oral      Patients Stated Pain Goal: 3 (29/51/88 4166)  Complications: No apparent anesthesia complications

## 2016-05-12 NOTE — Anesthesia Procedure Notes (Signed)
Procedure Name: Intubation Date/Time: 05/12/2016 12:55 PM Performed by: Tobin Chad Pre-anesthesia Checklist: Patient identified, Emergency Drugs available, Suction available and Patient being monitored Patient Re-evaluated:Patient Re-evaluated prior to inductionOxygen Delivery Method: Circle system utilized and Simple face mask Preoxygenation: Pre-oxygenation with 100% oxygen Intubation Type: IV induction and Inhalational induction Ventilation: Mask ventilation without difficulty Laryngoscope Size: Miller and 3 Grade View: Grade III Tube type: Oral Tube size: 7.0 mm Number of attempts: 3 Airway Equipment and Method: Video-laryngoscopy and Stylet Placement Confirmation: ETT inserted through vocal cords under direct vision,  positive ETCO2 and breath sounds checked- equal and bilateral (2 attempts CRNA - MDA - Glidescope with success) Secured at: 21 (com) cm Dental Injury: Teeth and Oropharynx as per pre-operative assessment  Difficulty Due To: Difficulty was unanticipated

## 2016-05-12 NOTE — Op Note (Addendum)
Preoperative Diagnosis: Abnormal uterine bleeding, Uterine fibroids, Anemia Postoperative Diagnosis: same  Procedure: Laparoscopy Assisted Vaginal Hysterectomy, Bilateral salpingectomy Surgeon: Dr. Janyth Pupa  Assistant: Dr. Christophe Louis Anesthetic: General  IVF: 2000cc  EBL: 250cc  UOP: 100cc  Specimens: 1) Bilateral fallopian tubes and ovaries with uterus   Findings: No free fluid or omental studding appreciated. Normal appearing liver, gallbladder and bowel. Enlarged uterus, normal fallopian tubes and ovaries bilaterally.    Procedure: The patient was taken to the operating room where general anesthesia was found to be adequate. The patient's abdomen was prepped with ChloraPrep. The perineum and vagina were prepped with multiple layers of Betadine. The patient was sterilely draped. A Foley catheter was placed in the bladder. A RUMI manipulator was placed inside the uterus. Gown and gloves were changed and attention was turned to the abdomen. An incision was made in the supraumbilical area and the Veress needle was inserted into the abdominal cavity without difficulty. Proper placement was confirmed using the saline drop test and opening pressure was 82mmHg. A pneumoperitoneum was obtained. The laparoscopic trocar and the laparoscope were placed under direct visualization. Two additional ports were placed in the right and left lower quadrants. Each area was injected with quarter percent Marcaine. A small incision was made and a 5 mm trocar was inserted into the abdominal cavity under direct visualization. An abdominal scan was performed with the findings noted above.   Attention was turned to the left adnexa and the ureter was idenitifed. Serial ligation of the left fallopian tube was performed from the fimbrae up to the proximal portion.  The proximal portion of the utero-ovarian ligament was ligated with the Harmonic.  The left round ligament was then grasped, elevated, fulgurated and divided using  the Harmonic. The anterior leaf of the broad ligament was incised inferiorly and superiorly and the retroperitoneal space was developed. The anterior leaf of the broad ligament was dissected medially and inferiorly to allow dissection of the vesicouterine flap.  Attention was turned to the right adnexa. In a similar fashion, the fallopian tube was identified and serial ligation was performed using the Harmonic.  The right round ligament was clamped, elevated, ligated, and divided with the Harmonic. The vesicouterine fold of peritoneum was incised anteriorly for full dissection of the vesicouterine flap. Hemostasis was noted. The case then proceeded to the vaginal portion.  The manipulator was removed and a weighted speculum was placed in the posterior vagina. The cervix was injected with half percent Lidocaine with epinephrine. The cervix was then circumferentially incised with the bovie and the bladder was dissected off the pubovesical cervical fascia. The posterior cul-de-sac was entered sharply. The same procedure was performed anteriorly.  A heany clamp was placed over the uterosacral ligaments bilaterally. These were transected and suture ligated with 0 vicryl. The cardinal ligaments were then clamped bilaterally and transected and suture ligated in a similar fashion. The uterine arteries and broad ligament was then serially clamped with heany clamps, transected and suture ligated bilaterally. The uterus and  bilateral fallopian tube were removed from the operative field and sent to pathology. Hemostasis was confirmed. Angle stitches were placed.  A final check was made for hemostasis and confirmed. The vaginal cuff was closed in a running locked fashion using 0 vicryl. Gown and gloves were changed and attention was turned to the abdomen.  The pneumoperitoneum was reestablished. The pelvis was irrigated and inspected, excellent hemostasis was noted.  Arista was placed in the abdomen.  The lateral 5 mm  trocars were removed under direct visualization. The pneumoperitoneum was allowed to escape. The subumbilical trocar was removed. Dermabond was placed over the three trocar sites. The patient tolerated her procedure well. She was awakened from her anesthetic without difficulty and then transported to the recovery room in stable condition. Sponge, needle, and instrument counts were correct. Dr. Landry Mellow assisted due to the complexity of the case.  Janyth Pupa, DO  [phone] (pager)  [phone] (office)
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
## 2016-05-12 NOTE — Anesthesia Postprocedure Evaluation (Signed)
Anesthesia Post Note  Patient: Carrington Mullenax  Procedure(s) Performed: Procedure(s) (LRB): LAPAROSCOPIC ASSISTED VAGINAL HYSTERECTOMY WITH SALPINGECTOMY (Bilateral)  Patient location during evaluation: PACU Anesthesia Type: General Level of consciousness: awake and alert Pain management: pain level controlled Vital Signs Assessment: post-procedure vital signs reviewed and stable Respiratory status: spontaneous breathing, nonlabored ventilation, respiratory function stable and patient connected to nasal cannula oxygen Cardiovascular status: blood pressure returned to baseline and stable Postop Assessment: no signs of nausea or vomiting Anesthetic complications: no        Last Vitals:  Vitals:   05/12/16 1615 05/12/16 1630  BP: (!) 94/59 (!) 93/57  Pulse: 81 83  Resp: 15 15  Temp:      Last Pain:  Vitals:   05/12/16 1630  TempSrc:   PainSc: 8    Pain Goal: Patients Stated Pain Goal: 3 (05/12/16 1008)               Robbye Dede A.

## 2016-05-13 DIAGNOSIS — D252 Subserosal leiomyoma of uterus: Secondary | ICD-10-CM | POA: Diagnosis not present

## 2016-05-13 LAB — BASIC METABOLIC PANEL
ANION GAP: 5 (ref 5–15)
CALCIUM: 7.8 mg/dL — AB (ref 8.9–10.3)
CO2: 24 mmol/L (ref 22–32)
CREATININE: 0.6 mg/dL (ref 0.44–1.00)
Chloride: 109 mmol/L (ref 101–111)
GFR calc Af Amer: >60 mL/min (ref >60)
Glucose, Bld: 83 mg/dL (ref 65–99)
Potassium: 3.4 mmol/L — ABNORMAL LOW (ref 3.5–5.1)
Sodium: 138 mmol/L (ref 135–145)

## 2016-05-13 LAB — CBC
HEMATOCRIT: 24 % — AB (ref 36.0–46.0)
Hemoglobin: 8.2 g/dL — ABNORMAL LOW (ref 12.0–15.0)
MCH: 31.7 pg (ref 26.0–34.0)
MCHC: 34.2 g/dL (ref 30.0–36.0)
MCV: 92.7 fL (ref 78.0–100.0)
Platelets: 242 10*3/uL (ref 150–400)
RBC: 2.59 MIL/uL — AB (ref 3.87–5.11)
RDW: 14 % (ref 11.5–15.5)
WBC: 14.9 10*3/uL — AB (ref 4.0–10.5)

## 2016-05-13 MED ORDER — IBUPROFEN 600 MG PO TABS: 600.0000 mg | ORAL_TABLET | Freq: Four times a day (QID) | ORAL | Status: DC | PRN

## 2016-05-13 NOTE — Discharge Instructions (Signed)
Laparoscopically Assisted Vaginal Hysterectomy, Care After Refer to this sheet in the next few weeks. These instructions provide you with information on caring for yourself after your procedure. Your health care provider may also give you more specific instructions. Your treatment has been planned according to current medical practices, but problems sometimes occur. Call your health care provider if you have any problems or questions after your procedure. What can I expect after the procedure? After your procedure, it is typical to have the following:  Abdominal pain. You will be given pain medicine to control it.  Sore throat from the breathing tube that was inserted during surgery. Follow these instructions at home:  Only take over-the-counter or prescription medicines for pain, discomfort, or fever as directed by your health care provider.    FOR PAIN MANAGEMENT: Take Ibuprofen every 6 hrs as needed for pain.  In between you may take the oxycodone- 1 tablets (also every 6hrs) as needed for pain.  Oxycodone may cause constipation- please take this medication only as needed and while on this medication, please take a stool softener twice daily.    Do not take aspirin. It can cause bleeding.  Do not drive when taking pain medicine.  Follow your health care provider's advice regarding diet, exercise, lifting, driving, and general activities.  Resume your usual diet as directed and allowed.  Get plenty of rest and sleep.  Do not douche, use tampons, or have sexual intercourse for at least 6 weeks, or until your health care provider gives you permission.  Change your bandages (dressings) as directed by your health care provider.  Monitor your temperature and notify your health care provider of a fever.  Take showers instead of baths for 2-3 weeks.  Do not drink alcohol until your health care provider gives you permission.  If you develop constipation, you may take a mild laxative  with your health care provider's permission. Bran foods may help with constipation problems. Drinking enough fluids to keep your urine clear or pale yellow may help as well.  Try to have someone home with you for 1-2 weeks to help around the house.  Keep all of your follow-up appointments as directed by your health care provider. Contact a health care provider if:  You have swelling, redness, or increasing pain around your incision sites.  You have pus coming from your incision.  You notice a bad smell coming from your incision.  Your incision breaks open.  You feel dizzy or lightheaded.  You have pain or bleeding when you urinate.  You have persistent diarrhea.  You have persistent nausea and vomiting.  You have abnormal vaginal discharge.  You have a rash.  You have any type of abnormal reaction or develop an allergy to your medicine.  You have poor pain control with your prescribed medicine. Get help right away if:  You have a fever.  You have severe abdominal pain.  You have chest pain.  You have shortness of breath.  You faint.  You have pain, swelling, or redness in your leg.  You have heavy vaginal bleeding with blood clots. This information is not intended to replace advice given to you by your health care provider. Make sure you discuss any questions you have with your health care provider. Document Released: 01/07/2011 Document Revised: 06/26/2015 Document Reviewed: 08/03/2012 Elsevier Interactive Patient Education  2017 [employer].

## 2016-05-13 NOTE — Progress Notes (Signed)
Postoperative Note Day # 1  S:  Patient resting comfortable in bed.  Pain controlled.  Tolerating CLD. + flatus, no BM.  Minimal spotting.  Ambulating without difficulty.  She denies n/v/f/c, SOB, or CP.  Overall doing well and reports no acute complaints.  O: Temp:  [97.4 F (36.3 C)-98.6 F (37 C)] 98.4 F (36.9 C) (04/12 0247) Pulse Rate:  [71-106] 84 (04/12 0247) Resp:  [12-18] 14 (04/12 0247) BP: (85-108)/(46-65) 95/50 (04/12 0247) SpO2:  [97 %-100 %] 100 % (04/12 0247) Weight:  [104 lb (47.2 kg)] 104 lb (47.2 kg) (04/11 1739) Gen: A&Ox3, NAD CV: RRR Resp: CTAB Abdomen: soft, NT, ND +BS Incision: Umbilical incision with honeycomb- clean & dry, dermabond on lateral trocar sites C/D/I Ext: No edema, no calf tenderness bilaterally, SCDs in place  Labs:  Results for orders placed or performed during the hospital encounter of 05/12/16 (from the past 24 hour(s))  Pregnancy, urine     Status: None   Collection Time: 05/12/16 10:00 AM  Result Value Ref Range   Preg Test, Ur NEGATIVE NEGATIVE  CBC     Status: Abnormal   Collection Time: 05/12/16 10:03 AM  Result Value Ref Range   WBC 11.7 (H) 4.0 - 10.5 K/uL   RBC 2.98 (L) 3.87 - 5.11 MIL/uL   Hemoglobin 9.3 (L) 12.0 - 15.0 g/dL   HCT 27.6 (L) 36.0 - 46.0 %   MCV 92.6 78.0 - 100.0 fL   MCH 31.2 26.0 - 34.0 pg   MCHC 33.7 30.0 - 36.0 g/dL   RDW 14.0 11.5 - 15.5 %   Platelets 259 150 - 400 K/uL  Prepare RBC     Status: None   Collection Time: 05/12/16 10:30 AM  Result Value Ref Range   Order Confirmation ORDER PROCESSED BY BLOOD BANK   CBC     Status: Abnormal   Collection Time: 05/13/16  6:12 AM  Result Value Ref Range   WBC 14.9 (H) 4.0 - 10.5 K/uL   RBC 2.59 (L) 3.87 - 5.11 MIL/uL   Hemoglobin 8.2 (L) 12.0 - 15.0 g/dL   HCT 24.0 (L) 36.0 - 46.0 %   MCV 92.7 78.0 - 100.0 fL   MCH 31.7 26.0 - 34.0 pg   MCHC 34.2 30.0 - 36.0 g/dL   RDW 14.0 11.5 - 15.5 %   Platelets 242 150 - 400 K/uL  Basic metabolic panel     Status:  Abnormal (Preliminary result)   Collection Time: 05/13/16  6:12 AM  Result Value Ref Range   Sodium 138 135 - 145 mmol/L   Potassium 3.4 (L) 3.5 - 5.1 mmol/L   Chloride 109 101 - 111 mmol/L   CO2 24 22 - 32 mmol/L   Glucose, Bld 83 65 - 99 mg/dL   BUN PENDING 6 - 20 mg/dL   Creatinine, Ser 0.60 0.44 - 1.00 mg/dL   Calcium 7.8 (L) 8.9 - 10.3 mg/dL   GFR calc non Af Amer >60 >60 mL/min   GFR calc Af Amer >60 >60 mL/min   Anion gap 5 5 - 15     A/P: Pt is a 36 y.o. G1P1 s/p LAVH, BS, POD#1  - Pain well controlled, will transition to oral pain medication this am -GU: UOP is adequate, foley removed this am, pt to void on her own today -GI: Advance to general diet -Activity: encouraged sitting up to chair and ambulation as tolerated -Prophylaxis: early ambulation -Labs: stable as above  Meeting postoperative milestones  appropriately, plan for discharge home later today.  Janyth Pupa, DO [phone] (pager) [phone] (office)

## 2016-05-13 NOTE — Progress Notes (Signed)
Patient discharged home with friend. Discharge instructions, paperwork, follow-up appts, and prescriptions reviewed with patient. No questions at this time.

## 2016-05-14 ENCOUNTER — Encounter (HOSPITAL_COMMUNITY): Payer: Self-pay | Admitting: Obstetrics & Gynecology

## 2016-05-14 NOTE — Discharge Summary (Signed)
Physician Discharge Summary  Patient ID: Mary Brock MRN: [id number] DOB/AGE: 1980-11-02 36 y.o.  Admit date: 05/12/2016 Discharge date: 05/13/2016  Admission Diagnoses:  Discharge Diagnoses:  Active Problems:   Abnormal uterine bleeding (AUB)   Discharged Condition: stable  Hospital Course: 63HD [id number] who presented for scheduled LAVH, BS due to AUB, uterine fibroids and symptomatic anemia.  Surgery went as scheduled, see op note for further information.  Her postoperative course was uncomplicated and she was discharged home in stable condition on POD #1.  Consults: None  Significant Diagnostic Studies: labs:  CBC Latest Ref Rng & Units 05/13/2016 05/12/2016 05/11/2016  WBC 4.0 - 10.5 K/uL 14.9(H) 11.7(H) -  Hemoglobin 12.0 - 15.0 g/dL 8.2(L) 9.3(L) 8.7(L)  Hematocrit 36.0 - 46.0 % 24.0(L) 27.6(L) 25.3(L)  Platelets 150 - 400 K/uL 242 259 -     Treatments: IV hydration, antibiotics: Ancef, analgesia: Percocet, Toradol and surgery: LAVH, BS  Discharge Exam: Blood pressure (!) 96/53, pulse 89, temperature 98.2 F (36.8 C), temperature source Oral, resp. rate 16, height 5\' 4"  (1.626 m), weight 104 lb (47.2 kg), SpO2 100 %. Gen: A&Ox3, NAD CV: RRR Resp: CTAB Abdomen: soft, NT, ND +BS Incision: Umbilical incision with honeycomb- clean & dry, dermabond on lateral trocar sites C/D/I Ext: No edema, no calf tenderness bilaterally, SCDs in place  Disposition: 01-Home or Self Care   Allergies as of 05/13/2016   No Known Allergies     Medication List    TAKE these medications   calcium carbonate 1500 (600 Ca) MG Tabs tablet Commonly known as:  OSCAL Take 600 mg of elemental calcium by mouth 2 (two) times daily.   ferrous sulfate 325 (65 FE) MG tablet Take 325 mg by mouth at bedtime.   multivitamin with minerals Tabs tablet Take 1 tablet by mouth daily.      Follow-up Information    Janyth Pupa, M, DO Follow up in 2 week(s).   Specialty:  Obstetrics and  Gynecology Contact information: [address] [place] [address] [phone]           Signed: Annalee Genta 05/14/2016, 7:47 AM

## 2016-05-14 NOTE — Discharge Summary (Signed)
Physician Discharge Summary  Patient ID: Mary Brock MRN: [id number] DOB/AGE: December 05, 1980 36 y.o.  Admit date: 05/11/2016 Discharge date: 05/11/2016  Admission Diagnoses: Symptomatic anemia  Discharge Diagnoses:  Active Problems:   Symptomatic anemia   Discharged Condition: stable  Hospital Course: Pt was given blood transfusion due to symptomatic anemia and upcoming surgery.  Consults: None  Significant Diagnostic Studies: labs: Starting Hgb 6.7  Treatments: IV hydration and 1upRBC transfusion  Discharge Exam: Blood pressure (!) 94/44, pulse 98, temperature 98.3 F (36.8 C), temperature source Oral, resp. rate 18, SpO2 100 %.  Disposition: 01-Home or Self Care   Allergies as of 05/11/2016   No Known Allergies     Medication List    TAKE these medications   calcium carbonate 1500 (600 Ca) MG Tabs tablet Commonly known as:  OSCAL Take 600 mg of elemental calcium by mouth 2 (two) times daily.   ferrous sulfate 325 (65 FE) MG tablet Take 325 mg by mouth at bedtime.   multivitamin with minerals Tabs tablet Take 1 tablet by mouth daily.      Follow-up Information    Janyth Pupa, M, DO Follow up in 1 day(s).   Specialty:  Obstetrics and Gynecology Contact information: [address] [place] [address] [phone]           Signed: Annalee Genta 05/14/2016, 3:02 PM

## 2016-05-15 LAB — BPAM RBC
BLOOD PRODUCT EXPIRATION DATE: 201804252359
BLOOD PRODUCT EXPIRATION DATE: 201804252359
BLOOD PRODUCT EXPIRATION DATE: 201805012359
ISSUE DATE / TIME: 201804101023
ISSUE DATE / TIME: 201804131245
Unit Type and Rh: 5100
Unit Type and Rh: 5100
Unit Type and Rh: 5100

## 2016-05-15 LAB — TYPE AND SCREEN
ABO/RH(D): O POS
ANTIBODY SCREEN: NEGATIVE
UNIT DIVISION: 0
Unit division: 0
Unit division: 0
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
